# Patient Record
Sex: Female | Born: 1975 | Race: White | Hispanic: No | Marital: Married | Smoking: Former smoker
Health system: Southern US, Community
[De-identification: ages and names within clinical notes are randomized; demographics above are authoritative.]

## PROBLEM LIST (undated history)

## (undated) DIAGNOSIS — J45909 Unspecified asthma, uncomplicated: Secondary | ICD-10-CM

## (undated) DIAGNOSIS — K589 Irritable bowel syndrome without diarrhea: Secondary | ICD-10-CM

## (undated) DIAGNOSIS — G4730 Sleep apnea, unspecified: Secondary | ICD-10-CM

## (undated) DIAGNOSIS — F419 Anxiety disorder, unspecified: Secondary | ICD-10-CM

## (undated) DIAGNOSIS — F329 Major depressive disorder, single episode, unspecified: Secondary | ICD-10-CM

## (undated) DIAGNOSIS — I10 Essential (primary) hypertension: Secondary | ICD-10-CM

## (undated) DIAGNOSIS — M797 Fibromyalgia: Secondary | ICD-10-CM

## (undated) DIAGNOSIS — F32A Depression, unspecified: Secondary | ICD-10-CM

## (undated) DIAGNOSIS — E119 Type 2 diabetes mellitus without complications: Secondary | ICD-10-CM

## (undated) HISTORY — PX: CHOLECYSTECTOMY: SHX55

---
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
## 2019-03-02 ENCOUNTER — Encounter: Payer: Self-pay | Admitting: *Deleted

## 2019-03-02 ENCOUNTER — Emergency Department: Payer: Managed Care, Other (non HMO)

## 2019-03-02 ENCOUNTER — Other Ambulatory Visit: Payer: Self-pay

## 2019-03-02 ENCOUNTER — Emergency Department
Admission: EM | Admit: 2019-03-02 | Discharge: 2019-03-02 | Disposition: A | Discharge status: Home / Self Care | Payer: Managed Care, Other (non HMO) | Attending: Student in an Organized Health Care Education/Training Program | Admitting: Student in an Organized Health Care Education/Training Program

## 2019-03-02 DIAGNOSIS — J45909 Unspecified asthma, uncomplicated: Secondary | ICD-10-CM | POA: Diagnosis not present

## 2019-03-02 DIAGNOSIS — I10 Essential (primary) hypertension: Secondary | ICD-10-CM | POA: Diagnosis not present

## 2019-03-02 DIAGNOSIS — R079 Chest pain, unspecified: Secondary | ICD-10-CM

## 2019-03-02 DIAGNOSIS — Z87891 Personal history of nicotine dependence: Secondary | ICD-10-CM | POA: Insufficient documentation

## 2019-03-02 DIAGNOSIS — E119 Type 2 diabetes mellitus without complications: Secondary | ICD-10-CM | POA: Diagnosis not present

## 2019-03-02 HISTORY — DX: Essential (primary) hypertension: I10

## 2019-03-02 HISTORY — DX: Type 2 diabetes mellitus without complications: E11.9

## 2019-03-02 HISTORY — DX: Sleep apnea, unspecified: G47.30

## 2019-03-02 HISTORY — DX: Depression, unspecified: F32.A

## 2019-03-02 HISTORY — DX: Anxiety disorder, unspecified: F41.9

## 2019-03-02 HISTORY — DX: Fibromyalgia: M79.7

## 2019-03-02 HISTORY — DX: Irritable bowel syndrome, unspecified: K58.9

## 2019-03-02 HISTORY — DX: Unspecified asthma, uncomplicated: J45.909

## 2019-03-02 LAB — BASIC METABOLIC PANEL
Anion gap: 13 (ref 5–15)
BUN: 13 mg/dL (ref 6–20)
CO2: 22 mmol/L (ref 22–32)
Calcium: 9.3 mg/dL (ref 8.9–10.3)
Chloride: 99 mmol/L (ref 98–111)
Creatinine, Ser: 0.54 mg/dL (ref 0.44–1.00)
GFR calc Af Amer: >60 mL/min (ref >60)
GFR calc non Af Amer: >60 mL/min (ref >60)
Glucose, Bld: 108 mg/dL — ABNORMAL HIGH (ref 70–99)
Potassium: 3.9 mmol/L (ref 3.5–5.1)
Sodium: 134 mmol/L — ABNORMAL LOW (ref 135–145)

## 2019-03-02 LAB — FIBRIN DERIVATIVES D-DIMER (ARMC ONLY): Fibrin derivatives D-dimer (ARMC): 221.98 ng/mL (FEU) (ref 0.00–499.00)

## 2019-03-02 LAB — CBC
HCT: 39.5 % (ref 36.0–46.0)
Hemoglobin: 13 g/dL (ref 12.0–15.0)
MCH: 26.4 pg (ref 26.0–34.0)
MCHC: 32.9 g/dL (ref 30.0–36.0)
MCV: 80.3 fL (ref 80.0–100.0)
Platelets: 330 10*3/uL (ref 150–400)
RBC: 4.92 MIL/uL (ref 3.87–5.11)
RDW: 14.6 % (ref 11.5–15.5)
WBC: 13.2 10*3/uL — ABNORMAL HIGH (ref 4.0–10.5)
nRBC: 0 % (ref 0.0–0.2)

## 2019-03-02 LAB — GLUCOSE, CAPILLARY: Glucose-Capillary: 98 mg/dL (ref 70–99)

## 2019-03-02 LAB — TROPONIN I (HIGH SENSITIVITY)
Troponin I (High Sensitivity): <2 ng/L (ref <18)
Troponin I (High Sensitivity): <2 ng/L (ref <18)

## 2019-03-02 NOTE — ED Triage Notes (Signed)
Pt to ED reporting sudden onset of CP starting at 1300 today. Pain was tight in nature. Chest pain has since subsided but pt continues to feel nausea and weakness. No cardiac hx. NO SOB or cough and no fever.

## 2019-03-02 NOTE — ED Notes (Signed)
Pt given cola and graham crackers after Dr. Quentin Cornwall approved.

## 2019-03-02 NOTE — ED Provider Notes (Signed)
[facility] Emergency Department Provider Note    First MD Initiated Contact with Patient 03/02/19 1653     (approximate)  I have reviewed the triage vital signs and the nursing notes.   HISTORY  Chief Complaint Chest Pain    HPI Gillian ScarceKathryn Feuerstein is a 43 y.o. female below listed past medical history presents the ER for midsternal chest pain and pressure that started around 1:00 just that she is about to eat.  Denies any fevers.  States it did feel like someone sitting on her chest.  Is never had pain like this before.  Does have a history anxiety but this feels different.  Denies any lower extremity swelling.  No nausea or vomiting.  Denies any history of heart disease.  Does have a history of asthma but this does not feel like asthma.    Past Medical History:  Diagnosis Date   Anxiety    Asthma    Depression    Diabetes mellitus without complication (HCC)    type II   Fibromyalgia    Hypertension    IBS (irritable bowel syndrome)    Sleep apnea    History reviewed. No pertinent family history. Past Surgical History:  Procedure Laterality Date   CHOLECYSTECTOMY     There are no active problems to display for this patient.     Prior to Admission medications   Not on File    Allergies Patient has no known allergies.    Social History Social History   Tobacco Use   Smoking status: Former Smoker    Types: Cigarettes   Smokeless tobacco: Never Used  Substance Use Topics   Alcohol use: Never    Frequency: Never   Drug use: Never    Review of Systems Patient denies headaches, rhinorrhea, blurry vision, numbness, shortness of breath, chest pain, edema, cough, abdominal pain, nausea, vomiting, diarrhea, dysuria, fevers, rashes or hallucinations unless otherwise stated above in HPI. ____________________________________________   PHYSICAL EXAM:  VITAL SIGNS: Vitals:   03/02/19 1700 03/02/19 1800  BP: 133/80 137/81    Pulse: 91 87  Resp: 16 16  Temp:    SpO2: 97% 97%    Constitutional: Alert and oriented.  Eyes: Conjunctivae are normal.  Head: Atraumatic. Nose: No congestion/rhinnorhea. Mouth/Throat: Mucous membranes are moist.   Neck: No stridor. Painless ROM.  Cardiovascular: Normal rate, regular rhythm. Grossly normal heart sounds.  Good peripheral circulation. Respiratory: Normal respiratory effort.  No retractions. Lungs CTAB. Gastrointestinal: Soft and nontender. No distention. No abdominal bruits. No CVA tenderness. Genitourinary:  Musculoskeletal: No lower extremity tenderness nor edema.  No joint effusions. Neurologic:  Normal speech and language. No gross focal neurologic deficits are appreciated. No facial droop Skin:  Skin is warm, dry and intact. No rash noted. Psychiatric: Mood and affect are normal. Speech and behavior are normal.  ____________________________________________   LABS (all labs ordered are listed, but only abnormal results are displayed)  Results for orders placed or performed during the hospital encounter of 03/02/19 (from the past 24 hour(s))  Basic metabolic panel     Status: Abnormal   Collection Time: 03/02/19  3:09 PM  Result Value Ref Range   Sodium 134 (L) 135 - 145 mmol/L   Potassium 3.9 3.5 - 5.1 mmol/L   Chloride 99 98 - 111 mmol/L   CO2 22 22 - 32 mmol/L   Glucose, Bld 108 (H) 70 - 99 mg/dL   BUN 13 6 - 20 mg/dL   Creatinine,  Ser 0.54 0.44 - 1.00 mg/dL   Calcium 9.3 8.9 - 53.610.3 mg/dL   GFR calc non Af Amer >60 >60 mL/min   GFR calc Af Amer >60 >60 mL/min   Anion gap 13 5 - 15  CBC     Status: Abnormal   Collection Time: 03/02/19  3:09 PM  Result Value Ref Range   WBC 13.2 (H) 4.0 - 10.5 K/uL   RBC 4.92 3.87 - 5.11 MIL/uL   Hemoglobin 13.0 12.0 - 15.0 g/dL   HCT 64.439.5 03.436.0 - 74.246.0 %   MCV 80.3 80.0 - 100.0 fL   MCH 26.4 26.0 - 34.0 pg   MCHC 32.9 30.0 - 36.0 g/dL   RDW 59.514.6 63.811.5 - 75.615.5 %   Platelets 330 150 - 400 K/uL   nRBC 0.0 0.0 - 0.2  %  Troponin I (High Sensitivity)     Status: None   Collection Time: 03/02/19  3:09 PM  Result Value Ref Range   Troponin I (High Sensitivity) <2 <18 ng/L  Glucose, capillary     Status: None   Collection Time: 03/02/19  3:57 PM  Result Value Ref Range   Glucose-Capillary 98 70 - 99 mg/dL  Troponin I (High Sensitivity)     Status: None   Collection Time: 03/02/19  5:36 PM  Result Value Ref Range   Troponin I (High Sensitivity) <2 <18 ng/L  Fibrin derivatives D-Dimer ([facility] only)     Status: None   Collection Time: 03/02/19  5:36 PM  Result Value Ref Range   Fibrin derivatives D-dimer ([facility]) 221.98 0.00 - 499.00 ng/mL (FEU)   ____________________________________________  EKG My review and personal interpretation at Time: 14:55   Indication: chest pain  Rate: 85  Rhythm: sinus Axis: normal Other: normal intervals, no stemi ____________________________________________  RADIOLOGY  I personally reviewed all radiographic images ordered to evaluate for the above acute complaints and reviewed radiology reports and findings.  These findings were personally discussed with the patient.  Please see medical record for radiology report.  ____________________________________________   PROCEDURES  Procedure(s) performed:  Procedures    Critical Care performed: no ____________________________________________   INITIAL IMPRESSION / ASSESSMENT AND PLAN / ED COURSE  Pertinent labs & imaging results that were available during my care of the patient were reviewed by me and considered in my medical decision making (see chart for details).   DDX: ACS, pericarditis, esophagitis, boerhaaves, pe, dissection, pna, bronchitis, costochondritis   Gillian ScarceKathryn Priego is a 43 y.o. who presents to the ED with symptoms as described above.  Patient well-appearing nontoxic appearing.  She is low risk by heart score.  Will order serial enzymes to rule out ACS.  She is low risk by Wells criteria will order  d-dimer to further re-stratify as she is on Implanon.  No evidence of pneumothorax pneumonia.  Abdominal exam is soft and benign.  Does not seem like pain referred from the abdomen.  Clinical Course as of Mar 01 1900  Tue Mar 02, 2019  1828 Repeat troponin is negative.  D-dimer negative.  At this point do believe patient stable and appropriate for outpatient follow-up.   [PR]    Clinical Course User Index [PR] Willy Eddyobinson, Coutney Wildermuth, MD    The patient was evaluated in Emergency Department today for the symptoms described in the history of present illness. He/she was evaluated in the context of the global COVID-19 pandemic, which necessitated consideration that the patient might be at risk for infection with the SARS-CoV-2 virus  that causes COVID-19. Institutional protocols and algorithms that pertain to the evaluation of patients at risk for COVID-19 are in a state of rapid change based on information released by regulatory bodies including the CDC and federal and state organizations. These policies and algorithms were followed during the patient's care in the ED.  As part of my medical decision making, I reviewed the following data within the [place] notes reviewed and incorporated, Labs reviewed, notes from prior ED visits and [place] Controlled Substance Database   ____________________________________________   FINAL CLINICAL IMPRESSION(S) / ED DIAGNOSES  Final diagnoses:  Chest pain, unspecified type      NEW MEDICATIONS STARTED DURING THIS VISIT:  There are no discharge medications for this patient.    Note:  This document was prepared using Dragon voice recognition software and may include unintentional dictation errors.    Merlyn Lot, MD 03/02/19 1901

## 2019-05-13 ENCOUNTER — Emergency Department: Payer: Managed Care, Other (non HMO)

## 2019-05-13 ENCOUNTER — Other Ambulatory Visit: Payer: Self-pay

## 2019-05-13 ENCOUNTER — Emergency Department
Admission: EM | Admit: 2019-05-13 | Discharge: 2019-05-13 | Disposition: A | Discharge status: Home / Self Care | Payer: Managed Care, Other (non HMO) | Attending: Emergency Medicine | Admitting: Emergency Medicine

## 2019-05-13 DIAGNOSIS — F419 Anxiety disorder, unspecified: Secondary | ICD-10-CM | POA: Insufficient documentation

## 2019-05-13 DIAGNOSIS — Z7984 Long term (current) use of oral hypoglycemic drugs: Secondary | ICD-10-CM | POA: Insufficient documentation

## 2019-05-13 DIAGNOSIS — J45909 Unspecified asthma, uncomplicated: Secondary | ICD-10-CM | POA: Insufficient documentation

## 2019-05-13 DIAGNOSIS — J988 Other specified respiratory disorders: Secondary | ICD-10-CM | POA: Diagnosis not present

## 2019-05-13 DIAGNOSIS — R438 Other disturbances of smell and taste: Secondary | ICD-10-CM | POA: Insufficient documentation

## 2019-05-13 DIAGNOSIS — Z20828 Contact with and (suspected) exposure to other viral communicable diseases: Secondary | ICD-10-CM | POA: Insufficient documentation

## 2019-05-13 DIAGNOSIS — B9789 Other viral agents as the cause of diseases classified elsewhere: Secondary | ICD-10-CM | POA: Diagnosis not present

## 2019-05-13 DIAGNOSIS — F329 Major depressive disorder, single episode, unspecified: Secondary | ICD-10-CM | POA: Insufficient documentation

## 2019-05-13 DIAGNOSIS — Z79899 Other long term (current) drug therapy: Secondary | ICD-10-CM | POA: Insufficient documentation

## 2019-05-13 DIAGNOSIS — Z87891 Personal history of nicotine dependence: Secondary | ICD-10-CM | POA: Insufficient documentation

## 2019-05-13 DIAGNOSIS — E119 Type 2 diabetes mellitus without complications: Secondary | ICD-10-CM | POA: Diagnosis not present

## 2019-05-13 DIAGNOSIS — I10 Essential (primary) hypertension: Secondary | ICD-10-CM | POA: Insufficient documentation

## 2019-05-13 DIAGNOSIS — R0602 Shortness of breath: Secondary | ICD-10-CM | POA: Diagnosis present

## 2019-05-13 LAB — SARS CORONAVIRUS 2 BY RT PCR (HOSPITAL ORDER, PERFORMED IN ~~LOC~~ HOSPITAL LAB): SARS Coronavirus 2: NEGATIVE

## 2019-05-13 MED ORDER — METHYLPREDNISOLONE 4 MG PO TBPK: ORAL_TABLET | ORAL | 0 refills | Status: DC

## 2019-05-13 MED ORDER — DEXAMETHASONE SODIUM PHOSPHATE 10 MG/ML IJ SOLN
10.0000 mg | Freq: Once | INTRAMUSCULAR | Status: AC
  Administered 2019-05-13: 14:00:00 10 mg via INTRAMUSCULAR
  Filled 2019-05-13: qty 1

## 2019-05-13 NOTE — ED Provider Notes (Signed)
[facility] Emergency Department Provider Note   ____________________________________________   First MD Initiated Contact with Patient 05/13/19 1147     (approximate)  I have reviewed the triage vital signs and the nursing notes.   HISTORY  Chief Complaint Shortness of Breath    HPI Sierra [name] is a 43 y.o. female patient stated weakness morning feeling "fuzzy headache".  Patient said her pulse ox showed 80% on room air.  Patient stated the reading increased to 95% in less than 5 minutes.  Patient has a history of asthma.  Patient stated 3 days ago she lost a sense of smell.  Patient states she might of been exposed to COVID-19 5 days ago at the doctor's office.  Patient is anxious and states that she has COVID-19 tests for tomorrow.   Past Medical History:  Diagnosis Date  . Anxiety   . Asthma   . Depression   . Diabetes mellitus without complication (HCC)    type II  . Fibromyalgia   . Hypertension   . IBS (irritable bowel syndrome)   . Sleep apnea     There are no active problems to display for this patient.   Past Surgical History:  Procedure Laterality Date  . CHOLECYSTECTOMY      Prior to Admission medications   Medication Sig Start Date End Date Taking? Authorizing Provider  busPIRone (BUSPAR) 15 MG tablet Take 15 mg by mouth 3 (three) times daily.   Yes [provider]  dicyclomine (BENTYL) 20 MG tablet Take 20 mg by mouth every 6 (six) hours.   Yes [provider]  fluticasone-salmeterol (ADVAIR HFA) 230-21 MCG/ACT inhaler Inhale 2 puffs into the lungs 2 (two) times daily.   Yes [provider]  gabapentin (NEURONTIN) 300 MG capsule Take 300 mg by mouth 3 (three) times daily.   Yes [provider]  lisinopril-hydrochlorothiazide (ZESTORETIC) 20-25 MG tablet Take 1 tablet by mouth daily.   Yes [provider]  metFORMIN (GLUMETZA) 1000 MG (MOD) 24 hr tablet Take 1,000 mg by mouth daily  with breakfast.   Yes [provider]  methylPREDNISolone (MEDROL DOSEPAK) 4 MG TBPK tablet Take Tapered dose as directed 05/13/19   Joni Reining, PA-C    Allergies Patient has no known allergies.  No family history on file.  Social History Social History   Tobacco Use  . Smoking status: Former Smoker    Types: Cigarettes  . Smokeless tobacco: Never Used  Substance Use Topics  . Alcohol use: Never    Frequency: Never  . Drug use: Never    Review of Systems  Constitutional: No fever/chills Eyes: No visual changes. ENT: No sore throat. Cardiovascular: Denies chest pain. Respiratory:  shortness of breath. Gastrointestinal: No abdominal pain.  No nausea, no vomiting.  No diarrhea.  No constipation. Genitourinary: Negative for dysuria. Musculoskeletal: Negative for back pain. Skin: Negative for rash. Neurological: Negative for headaches, focal weakness or numbness. Psychiatric:  Anxiety and depression. Endocrine:  Hypertension   ____________________________________________   PHYSICAL EXAM:  VITAL SIGNS: ED Triage Vitals  Enc Vitals Group     BP 05/13/19 1128 124/82     Pulse Rate 05/13/19 1128 (!) 103     Resp 05/13/19 1128 (!) 22     Temp 05/13/19 1128 98.6 F (37 C)     Temp Source 05/13/19 1128 Oral     SpO2 05/13/19 1128 97 %     Weight 05/13/19 1129 270 lb (122.5 kg)  Height 05/13/19 1129 5\' 7"  (1.702 m)     Head Circumference --      Peak Flow --      Pain Score 05/13/19 1129 0     Pain Loc --      Pain Edu? --      Excl. in [place]? --    Constitutional: Alert and oriented.  Anxious.   Eyes: Conjunctivae are normal. PERRL. EOMI. Head: Atraumatic. Nose: No congestion/rhinnorhea. Mouth/Throat: Mucous membranes are moist.  Oropharynx non-erythematous. Neck: No stridor. Hematological/Lymphatic/Immunilogical: No cervical lymphadenopathy. Cardiovascular: Normal rate, regular rhythm. Grossly normal heart sounds.  Good peripheral circulation.  Respiratory: Normal respiratory effort.  No retractions. Lungs CTAB. Musculoskeletal: No lower extremity tenderness nor edema.  No joint effusions. Neurologic:  Normal speech and language. No gross focal neurologic deficits are appreciated. No gait instability. Skin:  Skin is warm, dry and intact. No rash noted. Psychiatric: Mood and affect are normal. Speech and behavior are normal.  ____________________________________________   LABS (all labs ordered are listed, but only abnormal results are displayed)  Labs Reviewed  SARS CORONAVIRUS 2 (HOSPITAL ORDER, [facility])   ____________________________________________  EKG   ____________________________________________  [place]  ED MD interpretation:    Official radiology report(s): Dg Chest 1 View  Result Date: 05/13/2019 CLINICAL DATA:  Dyspnea EXAM: CHEST  1 VIEW COMPARISON:  03/02/2019 FINDINGS: The heart size and mediastinal contours are within normal limits. Mildly prominent perihilar interstitial markings bilaterally. No focal airspace consolidation, pleural effusion, or pneumothorax. The visualized skeletal structures are unremarkable. IMPRESSION: Mildly prominent bilateral perihilar interstitial markings. Findings may be related to bronchitic type lung changes or early viral/atypical infection. Electronically Signed   By: Davina Poke M.D.   On: 05/13/2019 12:55    ____________________________________________   PROCEDURES  Procedure(s) performed (including Critical Care):  Procedures   ____________________________________________   INITIAL IMPRESSION / ASSESSMENT AND PLAN / ED COURSE  As part of my medical decision making, I reviewed the following data within the [place]        Patient presents with shortness of breath.  Physical exam was grossly unremarkable.  Patient was able to walk up and down the hall with pulse ox showing no drop in oxygen levels.   Discussed checks x-ray findings with patient which is consistent with a viral respiratory findings.  Patient COVID-19 test is pending.  Patient advised to be notified telephonically of results if positive.  Patient advised continue previous medications.  Patient given a prescription for Medrol Dosepak.  Patient advised return to ED if condition worsens.  Advised self quarantine pending results of COVID-19 test.      ____________________________________________   FINAL CLINICAL IMPRESSION(S) / ED DIAGNOSES  Final diagnoses:  Viral respiratory illness     ED Discharge Orders         Ordered    methylPREDNISolone (MEDROL DOSEPAK) 4 MG TBPK tablet     05/13/19 1341           Note:  This document was prepared using Dragon voice recognition software and may include unintentional dictation errors.    Sable Feil, PA-C 05/13/19 1342    Nena Polio, MD 05/13/19 [phone]

## 2019-05-13 NOTE — ED Notes (Signed)
States that she may have been exposed to Covid-19 on Friday, and on Monday she lost her sense of smell. States that she woke up this morning around [phone] and felt "fuzzy headed" and she checked her O2 which was at 87%, rechecked it and it was at 95%. States that she feels very anxious.

## 2019-05-13 NOTE — Discharge Instructions (Addendum)
Advised self quarantine pending results of COVID-19 test results.  Continue previous medication and start steroids in the morning.

## 2019-05-13 NOTE — ED Notes (Signed)
Ambulated with the pt up and down the hall, O2 levels stayed between 96-99%

## 2019-05-13 NOTE — ED Triage Notes (Signed)
Reports she awoke today "feeling fuzzy headed" and checked her oxygen, states low reading. No breathing tx taken PTA. Pt reports SOB and anxious over oxygen reading. Pt states that after her oxygen monitor "jumped right back up to 94%". Pt states she has COVID test scheduled for this afternoon. Pt talks in full and complete sentences without difficulty. Pt alert and oriented X4, cooperative, RR even and unlabored, color WNL. Pt in NAD.

## 2019-05-21 ENCOUNTER — Other Ambulatory Visit: Payer: Self-pay

## 2019-05-21 ENCOUNTER — Emergency Department
Admission: EM | Admit: 2019-05-21 | Discharge: 2019-05-21 | Discharge status: Against Medical Advice | Payer: Managed Care, Other (non HMO)

## 2019-05-21 NOTE — ED Notes (Signed)
Called for pt in lobby x2 without answer

## 2019-05-21 NOTE — ED Notes (Signed)
Called for patient again with no answer.

## 2019-05-21 NOTE — ED Notes (Signed)
Called for patient, no response.  Attempted to call patient on cellphone with no answer.

## 2019-05-21 NOTE — ED Notes (Signed)
Pt called for Triage. Pt not visualized in lobby.

## 2020-01-10 ENCOUNTER — Other Ambulatory Visit: Payer: Self-pay

## 2020-01-10 ENCOUNTER — Encounter: Payer: Self-pay | Admitting: Emergency Medicine

## 2020-01-10 ENCOUNTER — Emergency Department
Admission: EM | Admit: 2020-01-10 | Discharge: 2020-01-11 | Disposition: A | Discharge status: Home / Self Care | Payer: Managed Care, Other (non HMO) | Attending: Emergency Medicine | Admitting: Emergency Medicine

## 2020-01-10 DIAGNOSIS — Z87891 Personal history of nicotine dependence: Secondary | ICD-10-CM | POA: Insufficient documentation

## 2020-01-10 DIAGNOSIS — Z7984 Long term (current) use of oral hypoglycemic drugs: Secondary | ICD-10-CM | POA: Insufficient documentation

## 2020-01-10 DIAGNOSIS — Y929 Unspecified place or not applicable: Secondary | ICD-10-CM | POA: Diagnosis not present

## 2020-01-10 DIAGNOSIS — S91331A Puncture wound without foreign body, right foot, initial encounter: Secondary | ICD-10-CM | POA: Insufficient documentation

## 2020-01-10 DIAGNOSIS — Z79899 Other long term (current) drug therapy: Secondary | ICD-10-CM | POA: Diagnosis not present

## 2020-01-10 DIAGNOSIS — J45909 Unspecified asthma, uncomplicated: Secondary | ICD-10-CM | POA: Diagnosis not present

## 2020-01-10 DIAGNOSIS — T63001A Toxic effect of unspecified snake venom, accidental (unintentional), initial encounter: Secondary | ICD-10-CM | POA: Diagnosis present

## 2020-01-10 DIAGNOSIS — I10 Essential (primary) hypertension: Secondary | ICD-10-CM | POA: Diagnosis not present

## 2020-01-10 DIAGNOSIS — W5911XA Bitten by nonvenomous snake, initial encounter: Secondary | ICD-10-CM | POA: Diagnosis not present

## 2020-01-10 DIAGNOSIS — E119 Type 2 diabetes mellitus without complications: Secondary | ICD-10-CM | POA: Diagnosis not present

## 2020-01-10 DIAGNOSIS — Y999 Unspecified external cause status: Secondary | ICD-10-CM | POA: Diagnosis not present

## 2020-01-10 DIAGNOSIS — Y939 Activity, unspecified: Secondary | ICD-10-CM | POA: Diagnosis not present

## 2020-01-10 LAB — CBC WITH DIFFERENTIAL/PLATELET
Abs Immature Granulocytes: 0.04 10*3/uL (ref 0.00–0.07)
Basophils Absolute: 0.1 10*3/uL (ref 0.0–0.1)
Basophils Relative: 1 %
Eosinophils Absolute: 0.2 10*3/uL (ref 0.0–0.5)
Eosinophils Relative: 1 %
HCT: 37.3 % (ref 36.0–46.0)
Hemoglobin: 12 g/dL (ref 12.0–15.0)
Immature Granulocytes: 0 %
Lymphocytes Relative: 31 %
Lymphs Abs: 3.4 10*3/uL (ref 0.7–4.0)
MCH: 24.8 pg — ABNORMAL LOW (ref 26.0–34.0)
MCHC: 32.2 g/dL (ref 30.0–36.0)
MCV: 77.2 fL — ABNORMAL LOW (ref 80.0–100.0)
Monocytes Absolute: 1 10*3/uL (ref 0.1–1.0)
Monocytes Relative: 9 %
Neutro Abs: 6.3 10*3/uL (ref 1.7–7.7)
Neutrophils Relative %: 58 %
Platelets: 359 10*3/uL (ref 150–400)
RBC: 4.83 MIL/uL (ref 3.87–5.11)
RDW: 15.1 % (ref 11.5–15.5)
WBC: 11 10*3/uL — ABNORMAL HIGH (ref 4.0–10.5)
nRBC: 0 % (ref 0.0–0.2)

## 2020-01-10 LAB — PROTIME-INR
INR: 1 (ref 0.8–1.2)
Prothrombin Time: 12.6 seconds (ref 11.4–15.2)

## 2020-01-10 LAB — FIBRINOGEN: Fibrinogen: 303 mg/dL (ref 210–475)

## 2020-01-10 MED ORDER — SODIUM CHLORIDE 0.9 % IV BOLUS: 500.0000 mL | Freq: Once | INTRAVENOUS | Status: DC

## 2020-01-10 NOTE — ED Notes (Signed)
Pt to ED for snake bite approx 1015 tonight. Has picture on phone, thinks copperhead. Blood/bite mark noted to left ankle. Both ankles measuring 9 inches. No swelling noted at this time

## 2020-01-10 NOTE — ED Provider Notes (Signed)
[facility] Emergency Department Provider Note ____________________________________________   First MD Initiated Contact with Patient 01/10/20 2306     (approximate)  I have reviewed the triage vital signs and the nursing notes.   HISTORY  Chief Complaint Animal Bite    HPI Sierra [name] is a 44 y.o. female with PMH as noted below who presents with a snakebite to the right heel, acute onset around 10:15 PM, and believed to be a copperhead.  The patient reports mild localized pain, around 2/10 in intensity, and no pain going up the leg, chills, or generalized symptoms.  She was able to get a picture of the snake on her phone.  Past Medical History:  Diagnosis Date  . Anxiety   . Asthma   . Depression   . Diabetes mellitus without complication ([place])    type II  . Fibromyalgia   . Hypertension   . IBS (irritable bowel syndrome)   . Sleep apnea     There are no problems to display for this patient.   Past Surgical History:  Procedure Laterality Date  . CHOLECYSTECTOMY      Prior to Admission medications   Medication Sig Start Date End Date Taking? Authorizing Provider  busPIRone (BUSPAR) 15 MG tablet Take 15 mg by mouth 3 (three) times daily.    [provider]  dicyclomine (BENTYL) 20 MG tablet Take 20 mg by mouth every 6 (six) hours.    [provider]  fluticasone-salmeterol (ADVAIR HFA) 230-21 MCG/ACT inhaler Inhale 2 puffs into the lungs 2 (two) times daily.    [provider]  gabapentin (NEURONTIN) 300 MG capsule Take 300 mg by mouth 3 (three) times daily.    [provider]  lisinopril-hydrochlorothiazide (ZESTORETIC) 20-25 MG tablet Take 1 tablet by mouth daily.    [provider]  metFORMIN (GLUMETZA) 1000 MG (MOD) 24 hr tablet Take 1,000 mg by mouth daily with breakfast.    [provider]  methylPREDNISolone (MEDROL DOSEPAK) 4 MG TBPK tablet Take Tapered dose as directed 05/13/19    Sable Feil, PA-C    Allergies Patient has no known allergies.  No family history on file.  Social History Social History   Tobacco Use  . Smoking status: Former Smoker    Types: Cigarettes  . Smokeless tobacco: Never Used  Substance Use Topics  . Alcohol use: Never  . Drug use: Never    Review of Systems  Constitutional: No fever/chills. Eyes: No redness. ENT: No sore throat. Cardiovascular: Denies chest pain. Respiratory: Denies shortness of breath. Gastrointestinal: No nausea. Genitourinary: Negative for flank pain. Musculoskeletal: Negative for back pain. Skin: Negative for rash. Neurological: Negative for headache.   ____________________________________________   PHYSICAL EXAM:  VITAL SIGNS: ED Triage Vitals  Enc Vitals Group     BP 01/10/20 2247 130/85     Pulse Rate 01/10/20 2247 (!) 102     Resp 01/10/20 2247 16     Temp 01/10/20 2247 98.2 F (36.8 C)     Temp Source 01/10/20 2247 Oral     SpO2 01/10/20 2247 98 %     Weight 01/10/20 2248 260 lb (117.9 kg)     Height 01/10/20 2248 5\' 8"  (1.727 m)     Head Circumference --      Peak Flow --      Pain Score 01/10/20 2248 2     Pain Loc --      Pain Edu? --  Excl. in [place]? --     Constitutional: Alert and oriented. Well appearing and in no acute distress. Eyes: Conjunctivae are normal.  Head: Atraumatic. Nose: No congestion/rhinnorhea. Mouth/Throat: Mucous membranes are moist.   Neck: Normal range of motion.  Cardiovascular: Normal rate, regular rhythm.  Good peripheral circulation. Respiratory: Normal respiratory effort.  No retractions.  Gastrointestinal: No distention.  Musculoskeletal: No lower extremity edema.  Extremities warm and well perfused.  Right heel with single 2 mm superficial puncture wound with no active bleeding.  No swelling or tenderness.  No erythema or induration. Neurologic:  Normal speech and language. No gross focal neurologic deficits are appreciated.  Skin:   Skin is warm and dry. No rash noted. Psychiatric: Mood and affect are normal. Speech and behavior are normal.  ____________________________________________   LABS (all labs ordered are listed, but only abnormal results are displayed)  Labs Reviewed  CBC WITH DIFFERENTIAL/PLATELET - Abnormal; Notable for the following components:      Result Value   WBC 11.0 (*)    MCV 77.2 (*)    MCH 24.8 (*)    All other components within normal limits  PROTIME-INR  BRAIN NATRIURETIC PEPTIDE  FIBRINOGEN   ____________________________________________  EKG   ____________________________________________  RADIOLOGY    ____________________________________________   PROCEDURES  Procedure(s) performed: No  Procedures  Critical Care performed: No ____________________________________________   INITIAL IMPRESSION / ASSESSMENT AND PLAN / ED COURSE  Pertinent labs & imaging results that were available during my care of the patient were reviewed by me and considered in my medical decision making (see chart for details).  44 year old female with PMH as noted above presents after an apparent snake bite to her right heel around 10:15 PM.  The patient got a picture of the snake on her phone and it appears consistent with a copperhead.  On exam, the patient is well-appearing.  Her vital signs are normal.  She has what appears to be 1 small puncture wound to the right heel with no surrounding swelling, erythema, induration, or abnormal warmth.  She reports relatively mild pain.  Overall I have a low suspicion for significant envenomation.  We will obtain labs and observe the patient for 6 hours.  ----------------------------------------- 3:59 AM on 01/11/2020 -----------------------------------------  It is now almost 6 hours since the bite.  The patient continues to have no swelling, redness, increasing pain, or other symptoms.  The wound appears the same.  Her lab work-up is unremarkable.   She is stable for discharge home at this time.  Return precautions given, and she expresses understanding.  ____________________________________________   FINAL CLINICAL IMPRESSION(S) / ED DIAGNOSES  Final diagnoses:  Snake bite, initial encounter      NEW MEDICATIONS STARTED DURING THIS VISIT:  New Prescriptions   No medications on file     Note:  This document was prepared using Dragon voice recognition software and may include unintentional dictation errors.   Dionne Bucy, MD 01/11/20 [phone]

## 2020-01-10 NOTE — ED Notes (Signed)
Pt stable at this time. Family at bedside.

## 2020-01-10 NOTE — ED Triage Notes (Signed)
Pt to triage via w/c with no distress noted, mask in place; pt reports copperhead bite PTA to rt heel--swelling and puncture noted to area

## 2020-01-11 LAB — BRAIN NATRIURETIC PEPTIDE: B Natriuretic Peptide: 13.7 pg/mL (ref 0.0–100.0)

## 2020-01-11 NOTE — ED Notes (Signed)
Pt continues to rest. No redness present around the bite area. No swelling around the bite area. Will continue to monitor.

## 2020-01-11 NOTE — Discharge Instructions (Addendum)
Return to the ER for new, worsening, or persistent severe pain, swelling, bleeding, fever or chills, or any other new or worsening symptoms that concern you.

## 2020-01-11 NOTE — ED Notes (Signed)
No redness or swelling noted upon reassessment. Pt denies changes in the pain and reports no pain when sitting in bed. Slight tenderness upon palpation of right heel.

## 2020-01-11 NOTE — ED Notes (Signed)
NO redness or swelling noted at time of discharge. Pt ambulatory and denies need for wheelchair.

## 2020-01-11 NOTE — ED Notes (Signed)
MD at bedside. 

## 2020-01-11 NOTE — ED Notes (Signed)
Pt up to the bedside commode. No swelling or redness or inflammation present. Pt reports some mild tenderness to the touch.

## 2020-10-20 NOTE — Progress Notes (Unsigned)
[facility] [address] [place], [place] [address]  Pulmonary Sleep Medicine   Office Visit Note  Patient Name: Sierra [name] DOB: [birth year] MRN [id number]  I connected with  Sierra [name] on 10/24/20 by a video enabled telemedicine application and verified that I am speaking with the correct person using two identifiers.   I discussed the limitations of evaluation and management by telemedicine. The patient expressed [name] and agreed to proceed.   Chief Complaint: Obstructive Sleep Apnea visit  Brief History:  Sierra [name] is seen today for follow up The patient has a 7 year history of sleep apnea. Patient is using PAP nightly.  The patient feels better after sleeping with PAP.  The patient reports benefit from PAP use. Reported sleepiness is  improved and the Epworth Sleepiness Score is 1 out of 24. The patient does take naps. The patient complains of the following: none. Unable to get download remotely. Patient will bring machine in for download.  ROS  General: (-) fever, (-) chills, (-) night sweat Nose and Sinuses: (-) nasal stuffiness or itchiness, (-) postnasal drip, (-) nosebleeds, (-) sinus trouble. Mouth and Throat: (-) sore throat, (-) hoarseness. Neck: (-) swollen glands, (-) enlarged thyroid, (-) neck pain. Respiratory: - cough, - shortness of breath, - wheezing. Neurologic: - numbness, - tingling. Psychiatric: - anxiety, - depression   Current Medication: Outpatient Encounter Medications as of 10/24/2020  Medication Sig  . cetirizine (ZYRTEC) 10 MG tablet Take by mouth.  . dicyclomine (BENTYL) 20 MG tablet Take 20 mg by mouth every 6 (six) hours.  Marland Kitchen esomeprazole (NEXIUM) 20 MG capsule Take by mouth.  . etonogestrel (NEXPLANON) 68 MG IMPL implant 68 mg by Subdermal route once.  . fluticasone-salmeterol (ADVAIR HFA) 230-21 MCG/ACT inhaler Inhale 2 puffs into the lungs 2 (two) times daily.  Marland Kitchen gabapentin (NEURONTIN) 300 MG capsule Take 300  mg by mouth 3 (three) times daily.  Marland Kitchen gabapentin (NEURONTIN) 600 MG tablet Take 1 tablet by mouth every night as needed for sleep/anxiety  . lisinopril-hydrochlorothiazide (ZESTORETIC) 20-25 MG tablet Take 1 tablet by mouth daily.  . metFORMIN (GLUMETZA) 1000 MG (MOD) 24 hr tablet Take 1,000 mg by mouth daily with breakfast.  . montelukast (SINGULAIR) 10 MG tablet Take by mouth.  Marland Kitchen PROAIR HFA 108 (90 Base) MCG/ACT inhaler Inhale 2 puff  every four to six hours as needed  . sertraline (ZOLOFT) 100 MG tablet Take by mouth.  Marland Kitchen VICTOZA 18 MG/3ML SOPN   . [DISCONTINUED] busPIRone (BUSPAR) 15 MG tablet Take 15 mg by mouth 3 (three) times daily.  . [DISCONTINUED] methylPREDNISolone (MEDROL DOSEPAK) 4 MG TBPK tablet Take Tapered dose as directed   No facility-administered encounter medications on file as of 10/24/2020.    Surgical History: Past Surgical History:  Procedure Laterality Date  . CHOLECYSTECTOMY      Medical History: Past Medical History:  Diagnosis Date  . Anxiety   . Asthma   . Depression   . Diabetes mellitus without complication (HCC)    type II  . Fibromyalgia   . Hypertension   . IBS (irritable bowel syndrome)   . Sleep apnea     Family History: Non contributory to the present illness  Social History: Social History   Socioeconomic History  . Marital status: Married    Spouse name: Not on file  . Number of children: Not on file  . Years of education: Not on file  . Highest education level: Not on file  Occupational History  .  Not on file  Tobacco Use  . Smoking status: Former Smoker    Types: Cigarettes  . Smokeless tobacco: Never Used  Substance and Sexual Activity  . Alcohol use: Never  . Drug use: Never  . Sexual activity: Yes  Other Topics Concern  . Not on file  Social History Narrative  . Not on file   Social Determinants of Health   Financial Resource Strain: Not on file  Food Insecurity: Not on file  Transportation Needs: Not on file   Physical Activity: Not on file  Stress: Not on file  Social Connections: Not on file  Intimate Partner Violence: Not on file    Vital Signs: There were no vitals taken for this visit.  Examination: General Appearance: The patient is well-developed, well-nourished, and in no distress. Neck Circumference: *** Skin: Gross inspection of skin unremarkable. Head: normocephalic, no gross deformities. Eyes: no gross deformities noted. ENT: ears appear grossly normal Neurologic: Alert and oriented. No involuntary movements.    EPWORTH SLEEPINESS SCALE:  Scale:  (0)= no chance of dozing; (1)= slight chance of dozing; (2)= moderate chance of dozing; (3)= high chance of dozing  Chance  Situtation    Sitting and reading: 0    Watching TV: 0    Sitting Inactive in public: 0    As a passenger in car: 0      Lying down to rest: 1    Sitting and talking: 0    Sitting quielty after lunch: 0    In a car, stopped in traffic: 0   TOTAL SCORE:   1 out of 24    SLEEP STUDIES:  1. Split 01/01/14 AHI 28 Spo62min 90%   CPAP COMPLIANCE DATA:  Date Range:  11/12/19-12/07/19  Average Daily Use: 8.6 hours  Median Use: 8.5  Compliance for > 4 Hours: 38%  AHI: 1.1 respiratory events per hour  Days Used: 11/26  Mask Leak: 22.0  95th Percentile Pressure: 8         LABS: No results found for this or any previous visit (from the past 2160 hour(s)).  Radiology: No results found.  No results found.  No results found.    Assessment and Plan: Patient Active Problem List   Diagnosis Date Noted  . OSA (obstructive sleep apnea) 10/24/2020  . Mild intermittent asthma without complication 10/24/2020  . Essential hypertension 10/24/2020  . Anxiety and depression 10/24/2020      The patient can tolerate PAP and reports benefit from PAP use. The patient was reminded how to clean machine and advised to change supplies regularly. The patient was asked to bring machine  in for download.   1. OSA (obstructive sleep apnea) Continue nightly use. Pt will bring machine in for download.  2. CPAP use counseling CPAP couseling-Discussed importance of adequate CPAP use as well as proper care and cleaning techniques of machine and all supplies.  3. Mild intermittent asthma without complication Continue inhalers as prescribed  4. Essential hypertension Continue current medication and f/u with PCP.  5. Anxiety and depression Continue Zoloft and f/u with PCP   General Counseling: I have discussed the findings of the evaluation and examination with Sierra [name].  I have also discussed any further diagnostic evaluation thatmay be needed or ordered today. Sierra [name] of the findings of todays visit. We also reviewed her medications today and discussed drug interactions and side effects including but not limited excessive drowsiness and altered mental states. We also discussed that there is always  a risk not just to her but also people around her. she has been encouraged to call the office with any questions or concerns that should arise related to todays visit.  No orders of the defined types were placed in this encounter.      I have personally obtained a history, examined the patient, evaluated laboratory and imaging results, formulated the assessment and plan and placed orders.  This patient was seen by Lynn Ito, PA-C in collaboration with Dr. Freda Munro as a part of collaborative care agreement.   Valentino Hue Sol Blazing, PhD, FAASM  Diplomate, American Board of Sleep Medicine    Yevonne Pax, MD [facility] Diplomate ABMS Pulmonary and Critical Care Medicine Sleep medicine

## 2020-10-24 ENCOUNTER — Ambulatory Visit (INDEPENDENT_AMBULATORY_CARE_PROVIDER_SITE_OTHER): Payer: Managed Care, Other (non HMO) | Admitting: Internal Medicine

## 2020-10-24 DIAGNOSIS — F419 Anxiety disorder, unspecified: Secondary | ICD-10-CM

## 2020-10-24 DIAGNOSIS — I10 Essential (primary) hypertension: Secondary | ICD-10-CM

## 2020-10-24 DIAGNOSIS — J4520 Mild intermittent asthma, uncomplicated: Secondary | ICD-10-CM | POA: Diagnosis not present

## 2020-10-24 DIAGNOSIS — F32A Depression, unspecified: Secondary | ICD-10-CM

## 2020-10-24 DIAGNOSIS — Z7189 Other specified counseling: Secondary | ICD-10-CM

## 2020-10-24 DIAGNOSIS — G4733 Obstructive sleep apnea (adult) (pediatric): Secondary | ICD-10-CM | POA: Diagnosis not present

## 2020-10-24 NOTE — Patient Instructions (Signed)

## 2021-06-29 IMAGING — CR CHEST - 2 VIEW
1 series · 2 of 2 positions shown · non-contrast
Comparison: None.

CLINICAL DATA: Pt to ED reporting sudden onset of CP starting at
5599 today. Pain was tight in nature. Chest pain has since subsided
but pt continues to feel nausea and weakness. No cardiac hx. NO SOB
or cough and no fever.Hx of high bp and diabetic.

EXAM:
CHEST - 2 VIEW

[Series 1: dg chest 2 view · 0.14mm/px · 2 of 2 slices shown]
[im 1/2]
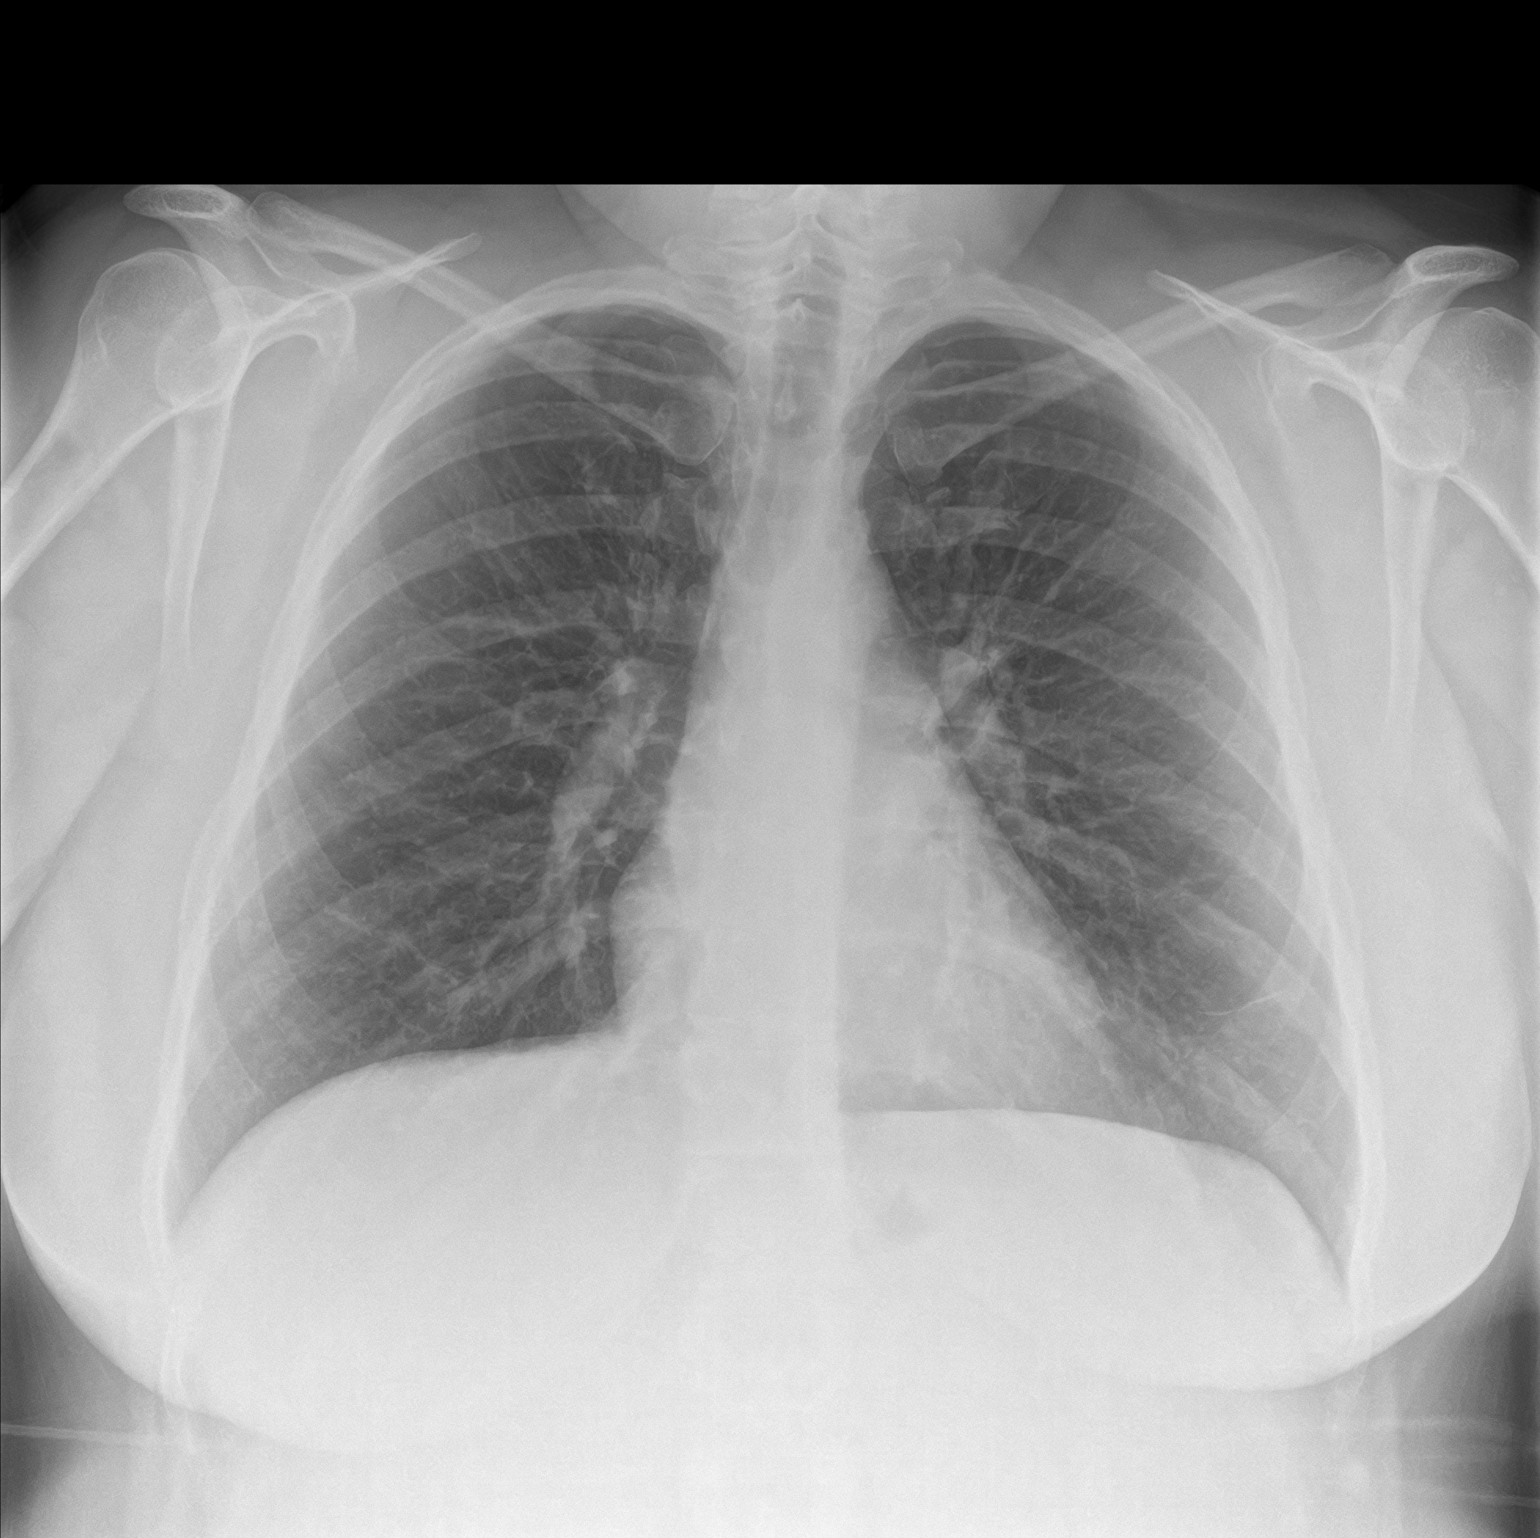
[im 2/2]
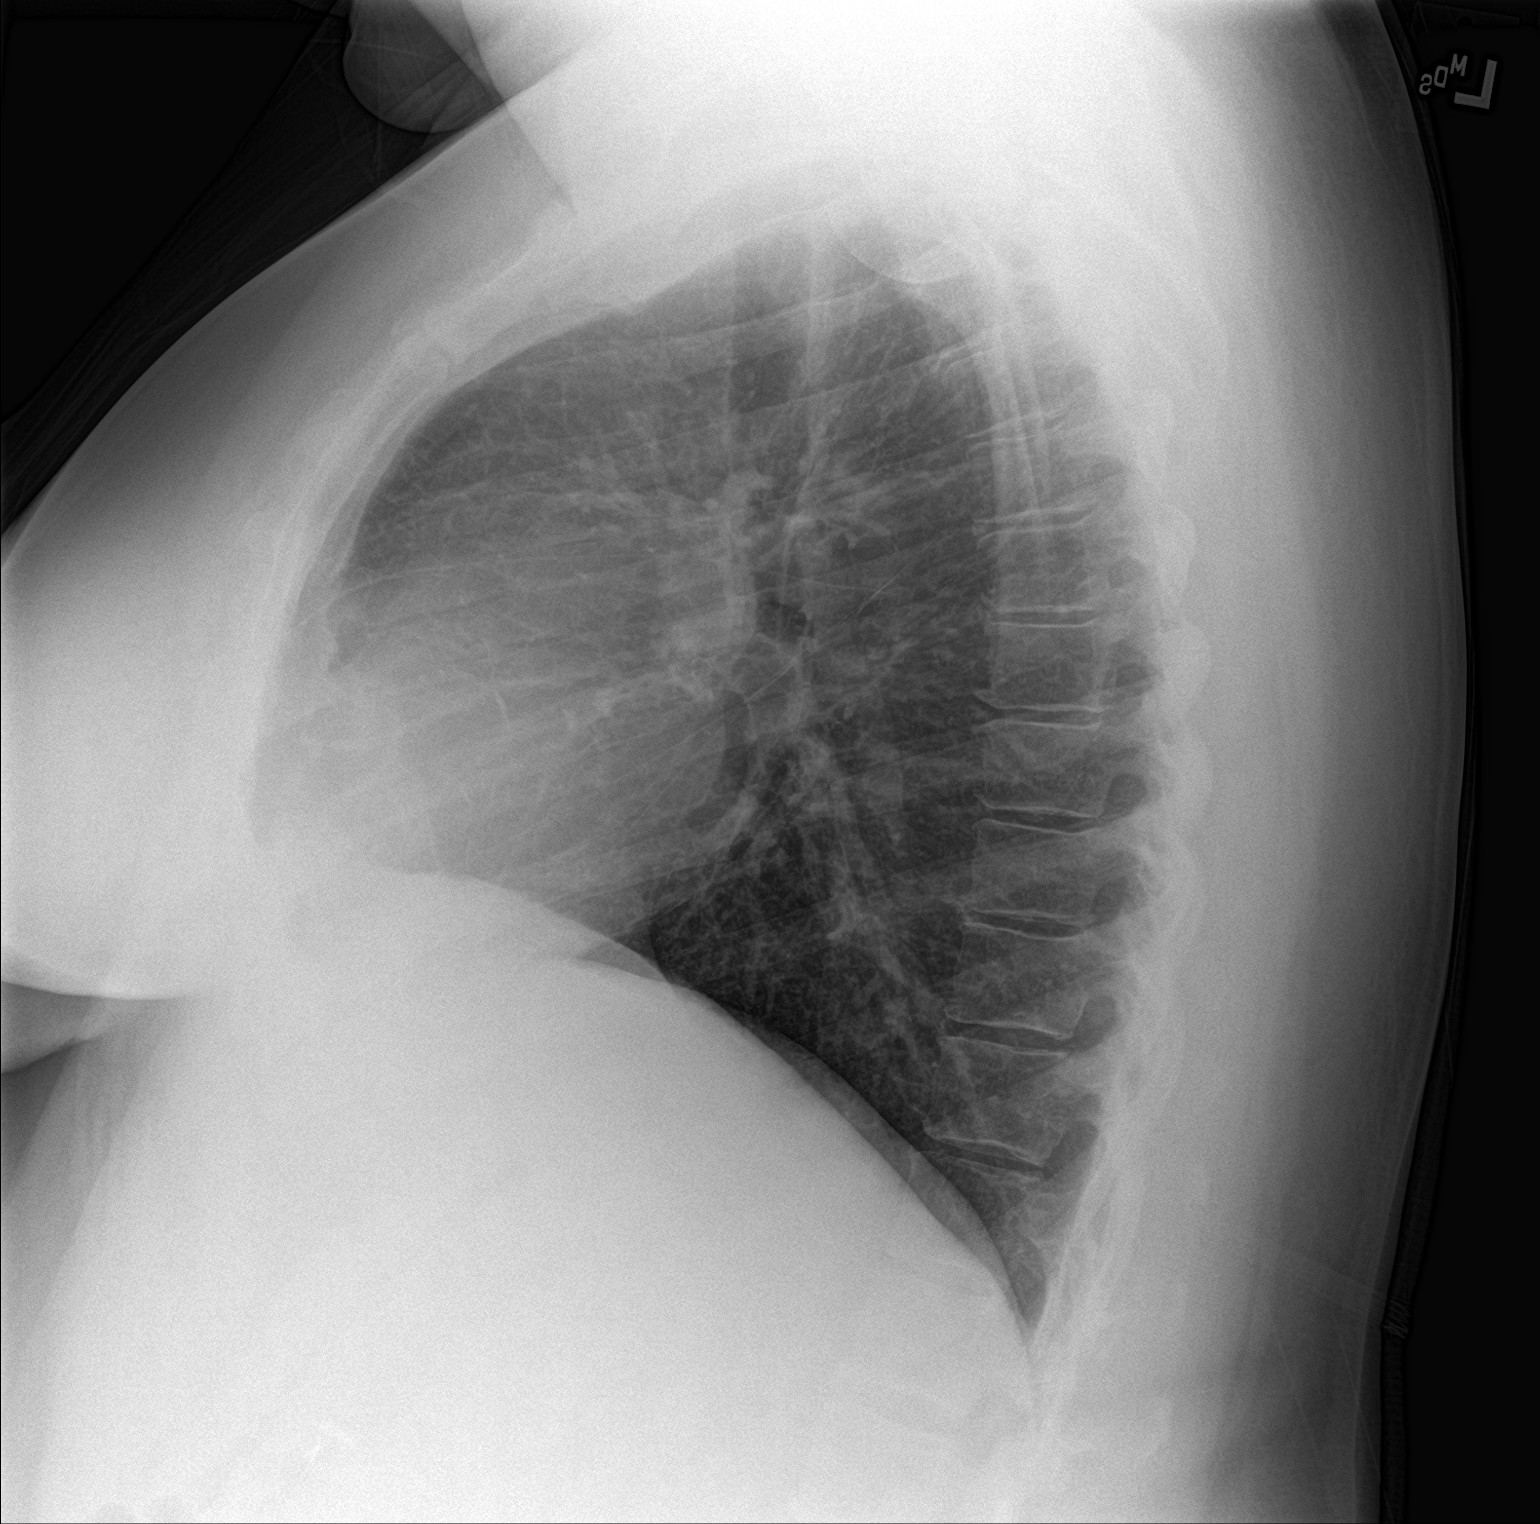

[2 of 2 positions shown; findings below may reference images not displayed]

FINDINGS: The heart size and mediastinal contours are within normal limits.
Both lungs are clear. No pleural effusion or pneumothorax. The
visualized skeletal structures are unremarkable.
IMPRESSION: No active cardiopulmonary disease.

## 2021-10-23 NOTE — Progress Notes (Signed)
Pt rescheduled

## 2021-10-24 ENCOUNTER — Ambulatory Visit: Payer: Managed Care, Other (non HMO) | Admitting: Internal Medicine

## 2021-12-21 ENCOUNTER — Ambulatory Visit: Payer: Managed Care, Other (non HMO) | Attending: Internal Medicine

## 2021-12-21 DIAGNOSIS — Z23 Encounter for immunization: Secondary | ICD-10-CM

## 2021-12-21 NOTE — Progress Notes (Signed)
   Covid-19 Vaccination Clinic  Name:  Sierra [name]    MRN: [id number] DOB: 15-Nov-1975  12/21/2021  [name] was observed post Covid-19 immunization for 15 minutes without incident. She was provided with Vaccine Information Sheet and instruction to access the V-Safe system.   [name] was instructed to call 911 with any severe reactions post vaccine: Difficulty breathing  Swelling of face and throat  A fast heartbeat  A bad rash all over body  Dizziness and weakness   Immunizations Administered     Name Date Dose VIS Date Route   Moderna Covid-19 vaccine Bivalent Booster 12/21/2021  2:03 PM 0.5 mL 03/17/2021 Intramuscular   Manufacturer: Gala Murdoch   Lot: [id number]   NDC: 83382-505-39

## 2021-12-24 ENCOUNTER — Other Ambulatory Visit: Payer: Self-pay

## 2021-12-24 MED ORDER — PFIZER COVID-19 VAC BIVALENT 30 MCG/0.3ML IM SUSP
INTRAMUSCULAR | 0 refills | Status: AC
  Filled 2021-12-24: qty 0.3, 1d supply, fill #0

## 2022-09-20 ENCOUNTER — Telehealth: Payer: Self-pay

## 2022-09-20 NOTE — Telephone Encounter (Signed)
Mychart msg sent. AS, CMA
